# Patient Record
Sex: Male | Born: 1960 | Race: Black or African American | Hispanic: No | Marital: Single | Smoking: Current every day smoker
Health system: Southern US, Community
[De-identification: ages and names within clinical notes are randomized; demographics above are authoritative.]

## PROBLEM LIST (undated history)

## (undated) DIAGNOSIS — I10 Essential (primary) hypertension: Secondary | ICD-10-CM

## (undated) DIAGNOSIS — M549 Dorsalgia, unspecified: Secondary | ICD-10-CM

## (undated) DIAGNOSIS — G8929 Other chronic pain: Secondary | ICD-10-CM

---

## 2004-11-24 ENCOUNTER — Emergency Department (HOSPITAL_COMMUNITY): Admission: EM | Admit: 2004-11-24 | Discharge: 2004-11-25 | Payer: Self-pay

## 2012-01-08 ENCOUNTER — Encounter (HOSPITAL_COMMUNITY): Payer: Self-pay | Admitting: *Deleted

## 2012-01-08 ENCOUNTER — Emergency Department (INDEPENDENT_AMBULATORY_CARE_PROVIDER_SITE_OTHER): Payer: Medicaid Other

## 2012-01-08 ENCOUNTER — Emergency Department (INDEPENDENT_AMBULATORY_CARE_PROVIDER_SITE_OTHER)
Admission: EM | Admit: 2012-01-08 | Discharge: 2012-01-08 | Disposition: A | Discharge status: Home / Self Care | Payer: Medicaid Other | Source: Home / Self Care | Attending: Emergency Medicine | Admitting: Emergency Medicine

## 2012-01-08 DIAGNOSIS — S6990XA Unspecified injury of unspecified wrist, hand and finger(s), initial encounter: Secondary | ICD-10-CM

## 2012-01-08 MED ORDER — IBUPROFEN 800 MG PO TABS: 800.0000 mg | ORAL_TABLET | Freq: Three times a day (TID) | ORAL | Status: AC

## 2012-01-08 NOTE — ED Provider Notes (Signed)
History     CSN: [id number]  Arrival date & time 01/08/12  1421   First MD Initiated Contact with Patient 01/08/12 1439      Chief Complaint  Patient presents with  . Finger Injury    (Consider location/radiation/quality/duration/timing/severity/associated sxs/prior treatment) HPI Comments: Finger got in a car door, the swelling has improved,but still hurts and cant move my finger well, maybe its broken? No numbness No tingling  Patient is a 51 y.o. male presenting with hand pain.  Hand Pain This is a new problem. The current episode started more than 2 days ago. The problem occurs constantly. The problem has been gradually improving. Exacerbated by: MOVING FINGER. The symptoms are relieved by ice and NSAIDs. He has tried rest and a cold compress for the symptoms. The treatment provided mild relief.    History reviewed. No pertinent past medical history.  History reviewed. No pertinent past surgical history.  History reviewed. No pertinent family history.  History  Substance Use Topics  . Smoking status: Current Everyday Smoker -- 1.0 packs/day for 20 years    Types: Cigarettes  . Smokeless tobacco: Not on file  . Alcohol Use: No      Review of Systems  Constitutional: Negative for fever.  Musculoskeletal: Positive for joint swelling.  Skin: Negative for rash and wound.  Neurological: Negative for weakness and numbness.    Allergies  Review of patient's allergies indicates no known allergies.  Home Medications   Current Outpatient Rx  Name Route Sig Dispense Refill  . IBUPROFEN 800 MG PO TABS Oral Take 1 tablet (800 mg total) by mouth 3 (three) times daily. 21 tablet 0    BP 112/81  Pulse 65  Temp(Src) 98.3 F (36.8 C) (Oral)  Resp 16  SpO2 98%  Physical Exam  Constitutional: He appears well-developed and well-nourished. No distress.  Neck: Neck supple.  Musculoskeletal: He exhibits tenderness.       Right wrist: He exhibits bony tenderness.    Neurological: He exhibits normal muscle tone. Coordination normal.  Skin: No rash noted.       ED Course  Procedures (including critical care time)  Labs Reviewed - No data to display Dg Finger Ring Right  01/08/2012  *RADIOLOGY REPORT*  Clinical Data: Ring finger pain.  Trauma.  RIGHT RING FINGER 2+V  Comparison: None.  Findings: Anatomic alignment.  There is no fracture identified. Soft tissue swelling is present over the PIP joint.  No radiopaque foreign body.  IMPRESSION: No acute osseous abnormality.  Soft tissue swelling.  Original Report Authenticated By: Andreas Newport, M.D.     1. Finger injury       MDM  R 4TH PIP COMPRESSION TYPE INJURY NO OBVIOUS FRACTURE PER RADIOLOGIST WITH LIMITED FINGER ROM        Jimmie Molly, MD 01/08/12 1925

## 2012-01-08 NOTE — ED Notes (Signed)
4 days ago pt caught his hand in a car door.  The swellling has gotten worse and he mainly c/o pain lf the right ring finger

## 2013-06-28 ENCOUNTER — Emergency Department (HOSPITAL_COMMUNITY)
Admission: EM | Admit: 2013-06-28 | Discharge: 2013-06-28 | Disposition: A | Discharge status: Home / Self Care | Payer: Medicaid Other | Source: Home / Self Care | Attending: Family Medicine | Admitting: Family Medicine

## 2013-06-28 ENCOUNTER — Emergency Department (INDEPENDENT_AMBULATORY_CARE_PROVIDER_SITE_OTHER): Payer: Medicaid Other

## 2013-06-28 ENCOUNTER — Encounter (HOSPITAL_COMMUNITY): Payer: Self-pay | Admitting: *Deleted

## 2013-06-28 DIAGNOSIS — J40 Bronchitis, not specified as acute or chronic: Secondary | ICD-10-CM

## 2013-06-28 DIAGNOSIS — J410 Simple chronic bronchitis: Secondary | ICD-10-CM

## 2013-06-28 MED ORDER — CEFUROXIME AXETIL 250 MG PO TABS: 250.0000 mg | ORAL_TABLET | Freq: Two times a day (BID) | ORAL | Status: DC

## 2013-06-28 NOTE — ED Notes (Signed)
Cough prod. of white sputum for at least week.  Sore throat onset 3 days ago.  Had chills and fever 3-4 days.

## 2013-06-28 NOTE — ED Provider Notes (Signed)
   History    CSN: [id number] Arrival date & time 06/28/13  1191  First MD Initiated Contact with Patient 06/28/13 1752     Chief Complaint  Patient presents with  . Cough   (Consider location/radiation/quality/duration/timing/severity/associated sxs/prior Treatment) Patient is a 52 y.o. male presenting with cough. The history is provided by the patient.  Cough Cough characteristics:  Productive Sputum characteristics:  White and frothy Severity:  Mild Onset quality:  Gradual Duration:  1 week Progression:  Unchanged Chronicity:  Recurrent Smoker: yes   Associated symptoms: chills, fever and sore throat   Associated symptoms: no shortness of breath and no wheezing    History reviewed. No pertinent past medical history. History reviewed. No pertinent past surgical history. Family History  Problem Relation Age of Onset  . Hypertension Mother   . Osteoarthritis Mother    History  Substance Use Topics  . Smoking status: Current Every Day Smoker -- 1.00 packs/day for 20 years    Types: Cigarettes  . Smokeless tobacco: Not on file  . Alcohol Use: No    Review of Systems  Constitutional: Positive for fever and chills.  HENT: Positive for congestion and sore throat.   Respiratory: Positive for cough. Negative for shortness of breath and wheezing.   Cardiovascular: Negative.   Gastrointestinal: Negative.     Allergies  Review of patient's allergies indicates no known allergies.  Home Medications   Current Outpatient Rx  Name  Route  Sig  Dispense  Refill  . cefUROXime (CEFTIN) 250 MG tablet   Oral   Take 1 tablet (250 mg total) by mouth 2 (two) times daily.   20 tablet   0    BP 131/73  Pulse 77  Temp(Src) 97.8 F (36.6 C) (Oral)  Resp 16  SpO2 100% Physical Exam  Nursing note and vitals reviewed. Constitutional: He is oriented to person, place, and time. He appears well-developed and well-nourished.  HENT:  Head: Normocephalic.  Right Ear: External ear  normal.  Left Ear: External ear normal.  Mouth/Throat: Oropharynx is clear and moist.  Eyes: Conjunctivae are normal. Pupils are equal, round, and reactive to light.  Neck: Normal range of motion. Neck supple.  Cardiovascular: Normal heart sounds and intact distal pulses.   Pulmonary/Chest: Effort normal. He has decreased breath sounds. He has no wheezes. He has rhonchi.  Lymphadenopathy:    He has no cervical adenopathy.  Neurological: He is alert and oriented to person, place, and time.  Skin: Skin is warm and dry.    ED Course  Procedures (including critical care time) Labs Reviewed - No data to display Dg Chest 2 View  06/28/2013   *RADIOLOGY REPORT*  Clinical Data: Cough.  Fever.  CHEST - 2 VIEW  Comparison: None.  Findings: The heart, mediastinum and hila are within normal limits. The lungs are clear.  No pleural effusion or pneumothorax.  There is a mild curvature of the thoracic spine, convex to the right. The bony thorax is otherwise unremarkable.  IMPRESSION: No active disease of the chest.   Original Report Authenticated By: Amie Portland, M.D.   1. Bronchitis due to tobacco use     MDM  X-rays reviewed and report per radiologist.   Linna Hoff, MD 06/28/13 [phone]

## 2015-07-18 ENCOUNTER — Emergency Department (HOSPITAL_COMMUNITY): Payer: Medicaid Other

## 2015-07-18 ENCOUNTER — Emergency Department (HOSPITAL_COMMUNITY)
Admission: EM | Admit: 2015-07-18 | Discharge: 2015-07-18 | Disposition: A | Discharge status: Home / Self Care | Payer: Medicaid Other | Attending: Emergency Medicine | Admitting: Emergency Medicine

## 2015-07-18 ENCOUNTER — Encounter (HOSPITAL_COMMUNITY): Payer: Self-pay | Admitting: Emergency Medicine

## 2015-07-18 DIAGNOSIS — Y9289 Other specified places as the place of occurrence of the external cause: Secondary | ICD-10-CM | POA: Diagnosis not present

## 2015-07-18 DIAGNOSIS — Y9389 Activity, other specified: Secondary | ICD-10-CM | POA: Diagnosis not present

## 2015-07-18 DIAGNOSIS — Z792 Long term (current) use of antibiotics: Secondary | ICD-10-CM | POA: Insufficient documentation

## 2015-07-18 DIAGNOSIS — Y998 Other external cause status: Secondary | ICD-10-CM | POA: Diagnosis not present

## 2015-07-18 DIAGNOSIS — W231XXA Caught, crushed, jammed, or pinched between stationary objects, initial encounter: Secondary | ICD-10-CM | POA: Insufficient documentation

## 2015-07-18 DIAGNOSIS — S6992XA Unspecified injury of left wrist, hand and finger(s), initial encounter: Secondary | ICD-10-CM | POA: Insufficient documentation

## 2015-07-18 DIAGNOSIS — M79646 Pain in unspecified finger(s): Secondary | ICD-10-CM

## 2015-07-18 DIAGNOSIS — Z720 Tobacco use: Secondary | ICD-10-CM | POA: Diagnosis not present

## 2015-07-18 DIAGNOSIS — G8929 Other chronic pain: Secondary | ICD-10-CM | POA: Diagnosis not present

## 2015-07-18 HISTORY — DX: Dorsalgia, unspecified: M54.9

## 2015-07-18 HISTORY — DX: Other chronic pain: G89.29

## 2015-07-18 MED ORDER — NAPROXEN 500 MG PO TABS: 500.0000 mg | ORAL_TABLET | Freq: Two times a day (BID) | ORAL | Status: DC

## 2015-07-18 NOTE — ED Notes (Signed)
Patient returned from radiology

## 2015-07-18 NOTE — ED Notes (Signed)
Pt A+ox4, reports car jack landed on L 5th finger while changing a tire earlier today.  C/o pain to finger.  Mild swelling noted.  Skin otherwise PWD.  MAEI.  +csm/+pulses.  No deformities noted.  Speaking full/clear sentences, rr even/un-lab.  Ambulatory with steady gait.  NAD.

## 2015-07-18 NOTE — ED Provider Notes (Signed)
CSN: [id number]     Arrival date & time 07/18/15  1832 History  This chart was scribed for Santiago Glad, PA-C, working with Blake Divine, MD by Chestine Spore, ED Scribe. The patient was seen in room WTR8/WTR8 at 7:10 PM.    Chief Complaint  Patient presents with  . Finger Injury    car jack landed on L 5th finger      The history is provided by the patient. No language interpreter was used.    HPI Comments: Steven Shea is a 54 y.o. male who presents to the Emergency Department complaining of left fifth finger injury onset PTA. Pt notes that he was changing a tire today when the car jack landed on his finger. Pt notes that his hand was caught between the wheel and the car. Pt notes that his pain is worsened with movement and that the pain is radiating to his left wrist. He states that he is having associated symptoms of mild swelling, aching sensation to left hand. He states that he has not tried any medications for the relief for his symptoms. He denies color change, wound, rash, numbness, tingling, and any other symptoms.   Past Medical History  Diagnosis Date  . Chronic back pain    History reviewed. No pertinent past surgical history. Family History  Problem Relation Age of Onset  . Hypertension Mother   . Osteoarthritis Mother    History  Substance Use Topics  . Smoking status: Current Every Day Smoker -- 1.00 packs/day for 20 years    Types: Cigarettes  . Smokeless tobacco: Not on file  . Alcohol Use: No    Review of Systems  Musculoskeletal: Positive for joint swelling and arthralgias.  Skin: Negative for color change, rash and wound.  Neurological: Negative for numbness.       No tingling      Allergies  Review of patient's allergies indicates no known allergies.  Home Medications   Prior to Admission medications   Medication Sig Start Date End Date Taking? Authorizing Provider  cefUROXime (CEFTIN) 250 MG tablet Take 1 tablet (250 mg total) by mouth  2 (two) times daily. 06/28/13   Linna Hoff, MD   BP 117/94 mmHg  Pulse 90  Temp(Src) 98.9 F (37.2 C) (Oral)  Resp 16  Ht 6' (1.829 m)  Wt 200 lb (90.719 kg)  BMI 27.12 kg/m2  SpO2 99% Physical Exam  Constitutional: He is oriented to person, place, and time. He appears well-developed and well-nourished. No distress.  HENT:  Head: Normocephalic and atraumatic.  Eyes: EOM are normal.  Neck: Neck supple. No tracheal deviation present.  Cardiovascular: Normal rate, regular rhythm and normal heart sounds.  Exam reveals no gallop and no friction rub.   No murmur heard. Pulmonary/Chest: Effort normal and breath sounds normal. No respiratory distress. He has no wheezes. He has no rales.  Musculoskeletal: Normal range of motion.  Mild swelling of the DIP of the left fifth digit. Pain with ROM. Skin intact.  Neurological: He is alert and oriented to person, place, and time.  Distal sensation of the left 5th finger intact  Skin: Skin is warm and dry.  Psychiatric: He has a normal mood and affect. His behavior is normal.  Nursing note and vitals reviewed.   ED Course  Procedures (including critical care time) DIAGNOSTIC STUDIES: Oxygen Saturation is 99% on RA, nl by my interpretation.    COORDINATION OF CARE: 7:13 PM Discussed treatment plan with pt at bedside  and pt agreed to plan.  Labs Review Labs Reviewed - No data to display  Imaging Review Dg Finger Little Left  07/18/2015   CLINICAL DATA:  Blow to the left little finger today. Pain. Initial encounter.  EXAM: LEFT LITTLE FINGER 2+V  COMPARISON:  None.  FINDINGS: Imaged bones, joints and soft tissues appear normal.  IMPRESSION: Negative exam.   Electronically Signed   By: Drusilla Kanner M.D.   On: 07/18/2015 19:42     EKG Interpretation None      MDM   Final diagnoses:  None  Patient presents today with a finger injury.  Xray negative.  Patient stable for discharge.  Return precautions given.   I personally  performed the services described in this documentation, which was scribed in my presence. The recorded information has been reviewed and is accurate.    Santiago Glad, PA-C 07/18/15 2233  Blake Divine, MD 07/21/15 [phone]

## 2018-11-16 ENCOUNTER — Emergency Department (HOSPITAL_COMMUNITY)
Admission: EM | Admit: 2018-11-16 | Discharge: 2018-11-16 | Disposition: A | Discharge status: Home / Self Care | Payer: Medicaid Other | Attending: Emergency Medicine | Admitting: Emergency Medicine

## 2018-11-16 ENCOUNTER — Encounter (HOSPITAL_COMMUNITY): Payer: Self-pay | Admitting: Emergency Medicine

## 2018-11-16 DIAGNOSIS — R21 Rash and other nonspecific skin eruption: Secondary | ICD-10-CM | POA: Diagnosis not present

## 2018-11-16 DIAGNOSIS — F17210 Nicotine dependence, cigarettes, uncomplicated: Secondary | ICD-10-CM | POA: Diagnosis not present

## 2018-11-16 DIAGNOSIS — I10 Essential (primary) hypertension: Secondary | ICD-10-CM | POA: Insufficient documentation

## 2018-11-16 HISTORY — DX: Essential (primary) hypertension: I10

## 2018-11-16 LAB — CBG MONITORING, ED: Glucose-Capillary: 91 mg/dL (ref 70–99)

## 2018-11-16 MED ORDER — TERBINAFINE HCL 1 % EX CREA: 1.0000 "application " | TOPICAL_CREAM | Freq: Two times a day (BID) | CUTANEOUS | 0 refills | Status: AC

## 2018-11-16 NOTE — ED Notes (Signed)
Bed: WTR7 Expected date:  Expected time:  Means of arrival:  Comments: 

## 2018-11-16 NOTE — Discharge Instructions (Signed)
Please see the information and instructions below regarding your visit.  Your diagnoses today include:  1. Rash and nonspecific skin eruption     Tests performed today include: See side panel of your discharge paperwork for testing performed today. Vital signs are listed at the bottom of these instructions.   Medications prescribed:    Take any prescribed medications only as prescribed, and any over the counter medications only as directed on the packaging.  Please apply the antifungal cream to the rash of the chest and the legs twice a day for a week.  You may use an over-the-counter topical Dr. Margart SicklesScholl's type treatment for plantar warts to try on the lesions of the right palm.  Home care instructions:  Please follow any educational materials contained in this packet.   Follow-up instructions: Please follow-up with your primary care provider in as soon as possible for further evaluation of your symptoms if they are not completely improved.    Return instructions:  Please return to the Emergency Department if you experience worsening symptoms.  Please return the emergency department if you develop any fevers, chills, weight loss, headaches, neck stiffness or pain, chest pain, shortness of breath, dizziness or lightheadedness with your symptoms. Please return if you have any other emergent concerns.  Additional Information:   Your vital signs today were: BP (!) 134/91 (BP Location: Left Arm)    Pulse 65    Temp 97.6 F (36.4 C) (Oral)    Resp 18    SpO2 99%  If your blood pressure (BP) was elevated on multiple readings during this visit above 130 for the top number or above 80 for the bottom number, please have this repeated by your primary care provider within one month. --------------  Thank you for allowing us to participate in your care today.

## 2018-11-16 NOTE — ED Notes (Signed)
CBG 91 

## 2018-11-16 NOTE — ED Provider Notes (Signed)
[facility]-EMERGENCY DEPT Provider Note   CSN: [id number] Arrival date & time: 11/16/18  1102     History   Chief Complaint Chief Complaint  Patient presents with  . Rash  . Foot Pain    HPI Steven Shea is a 57 y.o. male.  HPI  Patient is a 57 year old male with history of chronic back pain and hypertension presenting for a rash that he noticed on his legs, chest, and penis.  Patient reports that these lesions have been present for approximately 1 month.  He reports that the lesion on his penis showed up first.  Patient reports that he presented to the health department was tested for all sexual transmitted infections including HIV and syphilis.  He reports that his tests were nonreactive, and he was treated empirically for gonorrhea and chlamydia, but had no positive testing.  He reports that the lesions are no better couple weeks later, and he is no longer able to go to his primary care provider due to his PCP nor taking Medicaid, and presents to the emergency department.  Patient reports that all of the lesions are pruritic.  Patient denies any fevers, chills, night sweats, weight loss.  He denies any history of IVDU, autoimmune disease.  Patient denies any other systemic symptoms such as chest pain or shortness of breath, cough, abdominal pain, nausea, or vomiting.  Patient reports that he has 2 slightly raised lesions on the right palm but no other lesions of palms or soles.  No other treatments applied.  Past Medical History:  Diagnosis Date  . Chronic back pain   . Hypertension     There are no active problems to display for this patient.   History reviewed. No pertinent surgical history.      Home Medications    Prior to Admission medications   Not on File    Family History Family History  Problem Relation Age of Onset  . Hypertension Mother   . Osteoarthritis Mother     Social History Social History   Tobacco Use  . Smoking  status: Current Every Day Smoker    Packs/day: 1.00    Years: 20.00    Pack years: 20.00    Types: Cigarettes  . Smokeless tobacco: Never Used  Substance Use Topics  . Alcohol use: No  . Drug use: No     Allergies   Patient has no known allergies.   Review of Systems Review of Systems  Constitutional: Negative for chills and fever.  Respiratory: Negative for shortness of breath.   Cardiovascular: Negative for chest pain.  Gastrointestinal: Negative for abdominal pain, nausea and vomiting.  Skin: Positive for color change and rash. Negative for wound.  Allergic/Immunologic: Negative for immunocompromised state.     Physical Exam Updated Vital Signs BP (!) 134/91 (BP Location: Left Arm)   Pulse 65   Temp 97.6 F (36.4 C) (Oral)   Resp 18   SpO2 99%   Physical Exam  Constitutional: He appears well-developed and well-nourished. No distress.  Sitting comfortably in bed.  HENT:  Head: Normocephalic and atraumatic.  Eyes: Conjunctivae are normal. Right eye exhibits no discharge. Left eye exhibits no discharge.  EOMs normal to gross examination.  Neck: Normal range of motion.  Cardiovascular: Normal rate and regular rhythm.  No murmur heard. Pulmonary/Chest:  Normal respiratory effort. Patient converses comfortably. No audible wheeze or stridor.  Abdominal: He exhibits no distension.  Genitourinary:  Genitourinary Comments: EMT present as chaperone.  Patient  has a small papule 0.5 cm in diameter on the head of the penis.  Patient has some xerosis around the glans.  No erythema.  Musculoskeletal: Normal range of motion.  Neurological: He is alert.  Cranial nerves intact to gross observation. Patient moves extremities without difficulty.  Skin: Skin is warm and dry. He is not diaphoretic.  Patient has 2, raised, well-circumscribed plaques with overlying scale in bilateral pretibial region.  Patient also has a similar lesion 1 cm in diameter on the chest.  See clinical  photos for details.  No erythema surrounding any of these lesions. Right palm exhibits 2 raised, verrucous lesions. No other lesions of palms or soles.  No splinter hemorrhages.  Psychiatric: He has a normal mood and affect. His behavior is normal. Judgment and thought content normal.  Nursing note and vitals reviewed.      ED Treatments / Results  Labs (all labs ordered are listed, but only abnormal results are displayed) Labs Reviewed - No data to display  EKG None  Radiology No results found.  Procedures Procedures (including critical care time)  Medications Ordered in ED Medications - No data to display   Initial Impression / Assessment and Plan / ED Course  I have reviewed the triage vital signs and the nursing notes.  Pertinent labs & imaging results that were available during my care of the patient were reviewed by me and considered in my medical decision making (see chart for details).     Patient is nontoxic-appearing, afebrile, and in no acute distress.  Clinically, patient does not appear to have cutaneous manifestations of systemic disease such as secondary syphilis, endocarditis, disseminated gonococcus, RMSF, or SJS/TEN.  Patient was recently tested for syphilis and HIV at the health department and had negative results per his report.  Do not feel that with this history he needs retesting today.  Lesions at different parts of the body appeared to be of different character and possibly different sources.  I discussed with the patient that the lesion of the penis appears consistent with possible HPV wart.  Lesions of the right palm are of character and quality of plantar warts.  Unclear etiology of the lesions on his pretibial region and chest, however they do appear to have possible fungal nature.  I discussed with the patient that the ultimate management will be with primary care and/or dermatology to do biopsy if they are not improving.  Patient amenable to this  plan.  Return precautions given for any systemic symptoms such as rapidly worsening rash, fever or chills, headache, chest pain, coughing, shortness breath, dizziness lightheadedness.   Final Clinical Impressions(s) / ED Diagnoses   Final diagnoses:  Rash and nonspecific skin eruption    ED Discharge Orders         Ordered    terbinafine (LAMISIL) 1 % cream  2 times daily     11/16/18 1317           Elisha Ponder, PA-C 11/16/18 1426    Doug Sou, MD 11/16/18 [phone]

## 2018-11-16 NOTE — Progress Notes (Signed)
[facility]. Contact information placed on dc instructions. ED RN to give pt information at time of dc.  Isidoro DonningAlesia Tamila Gaulin RN CCM Case Mgmt phone [phone]

## 2018-11-16 NOTE — ED Triage Notes (Signed)
Pt reports getting sores all over body for over 3 weeks. Reports went to health department who ran tests and gave medications for it but reports that sores arent getting better and are painful. Pt also having pains in bilat feet. PCP quit taking medicaid so cant afford to see him.

## 2022-03-12 ENCOUNTER — Other Ambulatory Visit: Payer: Self-pay

## 2022-03-12 ENCOUNTER — Emergency Department (HOSPITAL_COMMUNITY): Payer: Medicaid Other

## 2022-03-12 ENCOUNTER — Encounter (HOSPITAL_COMMUNITY): Payer: Self-pay

## 2022-03-12 ENCOUNTER — Emergency Department (HOSPITAL_COMMUNITY)
Admission: EM | Admit: 2022-03-12 | Discharge: 2022-03-12 | Disposition: A | Discharge status: Home / Self Care | Payer: Medicaid Other | Attending: Emergency Medicine | Admitting: Emergency Medicine

## 2022-03-12 DIAGNOSIS — S60221A Contusion of right hand, initial encounter: Secondary | ICD-10-CM | POA: Diagnosis not present

## 2022-03-12 DIAGNOSIS — W208XXA Other cause of strike by thrown, projected or falling object, initial encounter: Secondary | ICD-10-CM | POA: Diagnosis not present

## 2022-03-12 DIAGNOSIS — S6991XA Unspecified injury of right wrist, hand and finger(s), initial encounter: Secondary | ICD-10-CM | POA: Diagnosis present

## 2022-03-12 MED ORDER — ACETAMINOPHEN 500 MG PO TABS
1000.0000 mg | ORAL_TABLET | Freq: Once | ORAL | Status: AC
  Administered 2022-03-12: 1000 mg via ORAL
  Filled 2022-03-12: qty 2

## 2022-03-12 MED ORDER — IBUPROFEN 200 MG PO TABS
400.0000 mg | ORAL_TABLET | Freq: Once | ORAL | Status: AC
  Administered 2022-03-12: 400 mg via ORAL
  Filled 2022-03-12: qty 2

## 2022-03-12 NOTE — Discharge Instructions (Signed)
No signs of broken bones today.  Continue to ice, take 2 Tylenol and 2 ibuprofen together every 6 hours for pain. ?

## 2022-03-12 NOTE — ED Triage Notes (Signed)
Patient reports that he was changing a tire with a scissors jack and the jack fell over and caught his right hand 2 days ago. Patient c/o pain and swelling to the right hand. ?

## 2022-03-12 NOTE — ED Provider Notes (Signed)
?[facility]-EMERGENCY DEPT ?Provider Note ? ? ?CSN: [id number] ?Arrival date & time: 03/12/22  1029 ? ?  ? ?History ? ?Chief Complaint  ?Patient presents with  ? Hand Injury  ? ? ?Steven Shea is a 61 y.o. male. ? ?Patient is a 61 year old male with no significant medical problems presenting today with right hand pain and swelling.  Patient reports that he was changing a tire with a jack 2 days ago when the jack fell over catching his right hand.  Since that time his hand does hurt but it has become more painful today.  It has been swollen the entire time.  He denies any numbness or tingling of his fingers.  No pain in his wrist.  He did take an ibuprofen 2 days ago but has not had anything else for the pain. ? ?The history is provided by the patient.  ?Hand Injury ? ?  ? ?Home Medications ?Prior to Admission medications   ?Medication Sig Start Date End Date Taking? Authorizing Provider  ?terbinafine (LAMISIL) 1 % cream Apply 1 application topically 2 (two) times daily. Apply to rash of chest and legs twice a day for 7 days. 11/16/18   Elisha Ponder, PA-C  ?   ? ?Allergies    ?Patient has no known allergies.   ? ?Review of Systems   ?Review of Systems ? ?Physical Exam ?Updated Vital Signs ?BP (!) 134/109 (BP Location: Left Arm)   Pulse 63   Temp 98.2 ?F (36.8 ?C) (Oral)   Resp 16   Ht 6' (1.829 m)   Wt 102.1 kg   SpO2 98%   BMI 30.52 kg/m?  ?Physical Exam ?Vitals and nursing note reviewed.  ?Cardiovascular:  ?   Rate and Rhythm: Normal rate.  ?Pulmonary:  ?   Effort: Pulmonary effort is normal.  ?Musculoskeletal:     ?   General: Tenderness and signs of injury present.  ?     Hands: ? ?Skin: ?   General: Skin is warm and dry.  ?Neurological:  ?   Mental Status: He is alert. Mental status is at baseline.  ?Psychiatric:     ?   Mood and Affect: Mood normal.  ? ? ?ED Results / Procedures / Treatments   ?Labs ?(all labs ordered are listed, but only abnormal results are displayed) ?Labs  Reviewed - No data to display ? ?EKG ?None ? ?Radiology ?DG Hand Complete Right ? ?Result Date: 03/12/2022 ?CLINICAL DATA:  Injury 2 days ago with pain and swelling of the thumb and metacarpals EXAM: RIGHT HAND - COMPLETE 3+ VIEW COMPARISON:  01/08/2012 FINDINGS: No evidence of acute fracture. There is chronic degenerative change and mild subluxation at the MCP joint of the thumb. Other bones in the region appear normal. IMPRESSION: No acute or traumatic finding. 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 Electronically Signed   By: Paulina Fusi M.D.   On: 03/12/2022 11:10   ? ?Procedures ?Procedures  ? ? ?Medications Ordered in ED ?Medications  ?acetaminophen (TYLENOL) tablet 1,000 mg (1,000 mg Oral Given 03/12/22 1114)  ?ibuprofen (ADVIL) tablet 400 mg (400 mg Oral Given 03/12/22 1114)  ? ? ?ED Course/ Medical Decision Making/ A&P ?  ?                        ?Medical Decision Making ?Amount and/or Complexity of Data Reviewed ?Radiology: ordered. ? ?Risk ?OTC drugs. ? ? ?Patient is a 61 year old  male presenting today with complaints of hand pain after an injury 2 days ago.  He has significant swelling over the fourth and fifth metacarpals.  Concern for possible fracture.  He has no wrist pain or discomfort with low suspicion for acute wrist injury. ? ?3:49 PM ?I independently interpreted and visualized patient's x-ray today which shows no evidence of acute fracture.  Radiology reports chronic changes in the MCP joint of the thumb consistent with arthritis but no other acute findings.  We will treat for hand contusion.  Findings were discussed with the patient.  He is stable for discharge at this time. ? ? ? ? ? ? ? ?Final Clinical Impression(s) / ED Diagnoses ?Final diagnoses:  ?Contusion of right hand, initial encounter  ? ? ?Rx / DC Orders ?ED Discharge Orders   ? ? None  ? ?  ? ? ?  ?Gwyneth Sprout, MD ?03/12/22 1549 ? ?

## 2022-11-20 ENCOUNTER — Other Ambulatory Visit: Payer: Self-pay | Admitting: Internal Medicine

## 2022-11-20 DIAGNOSIS — Z87891 Personal history of nicotine dependence: Secondary | ICD-10-CM

## 2023-05-25 IMAGING — CR DG HAND COMPLETE 3+V*R*
3 series · 3 of 3 positions shown · non-contrast
Comparison: 01/08/2012

CLINICAL DATA: Injury 2 days ago with pain and swelling of the
thumb and metacarpals

EXAM:
RIGHT HAND - COMPLETE 3+ VIEW

[x hand pa right]
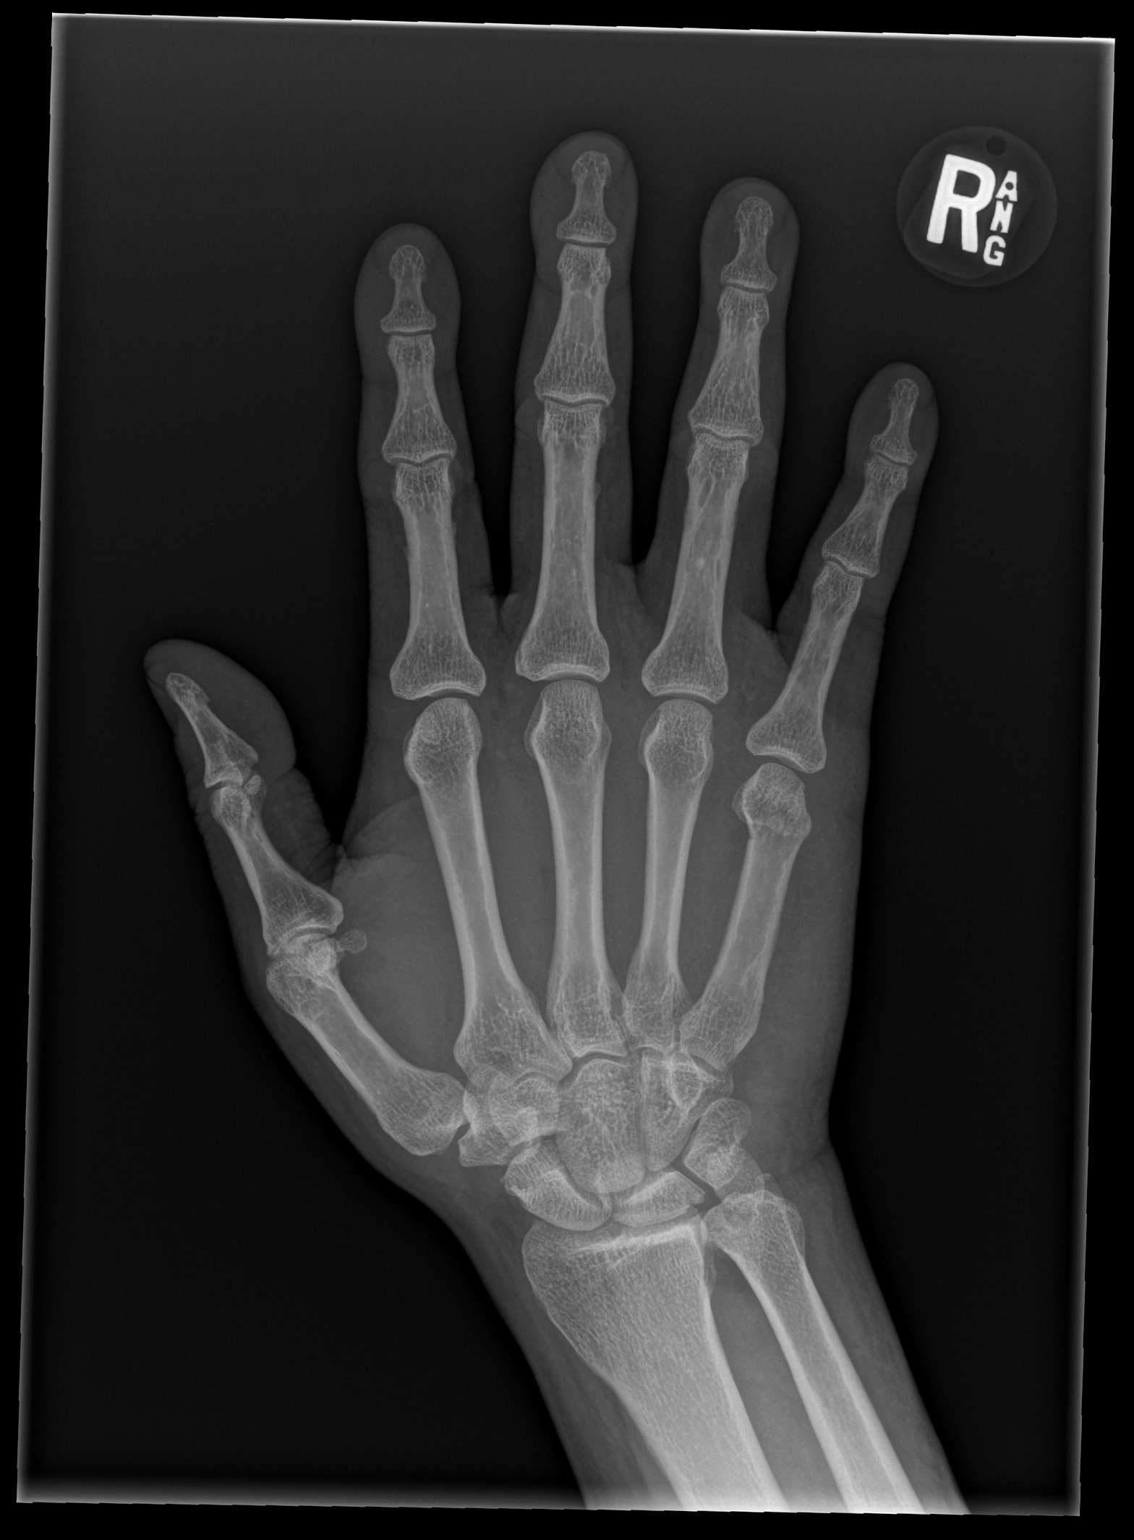

[x hand obl right]
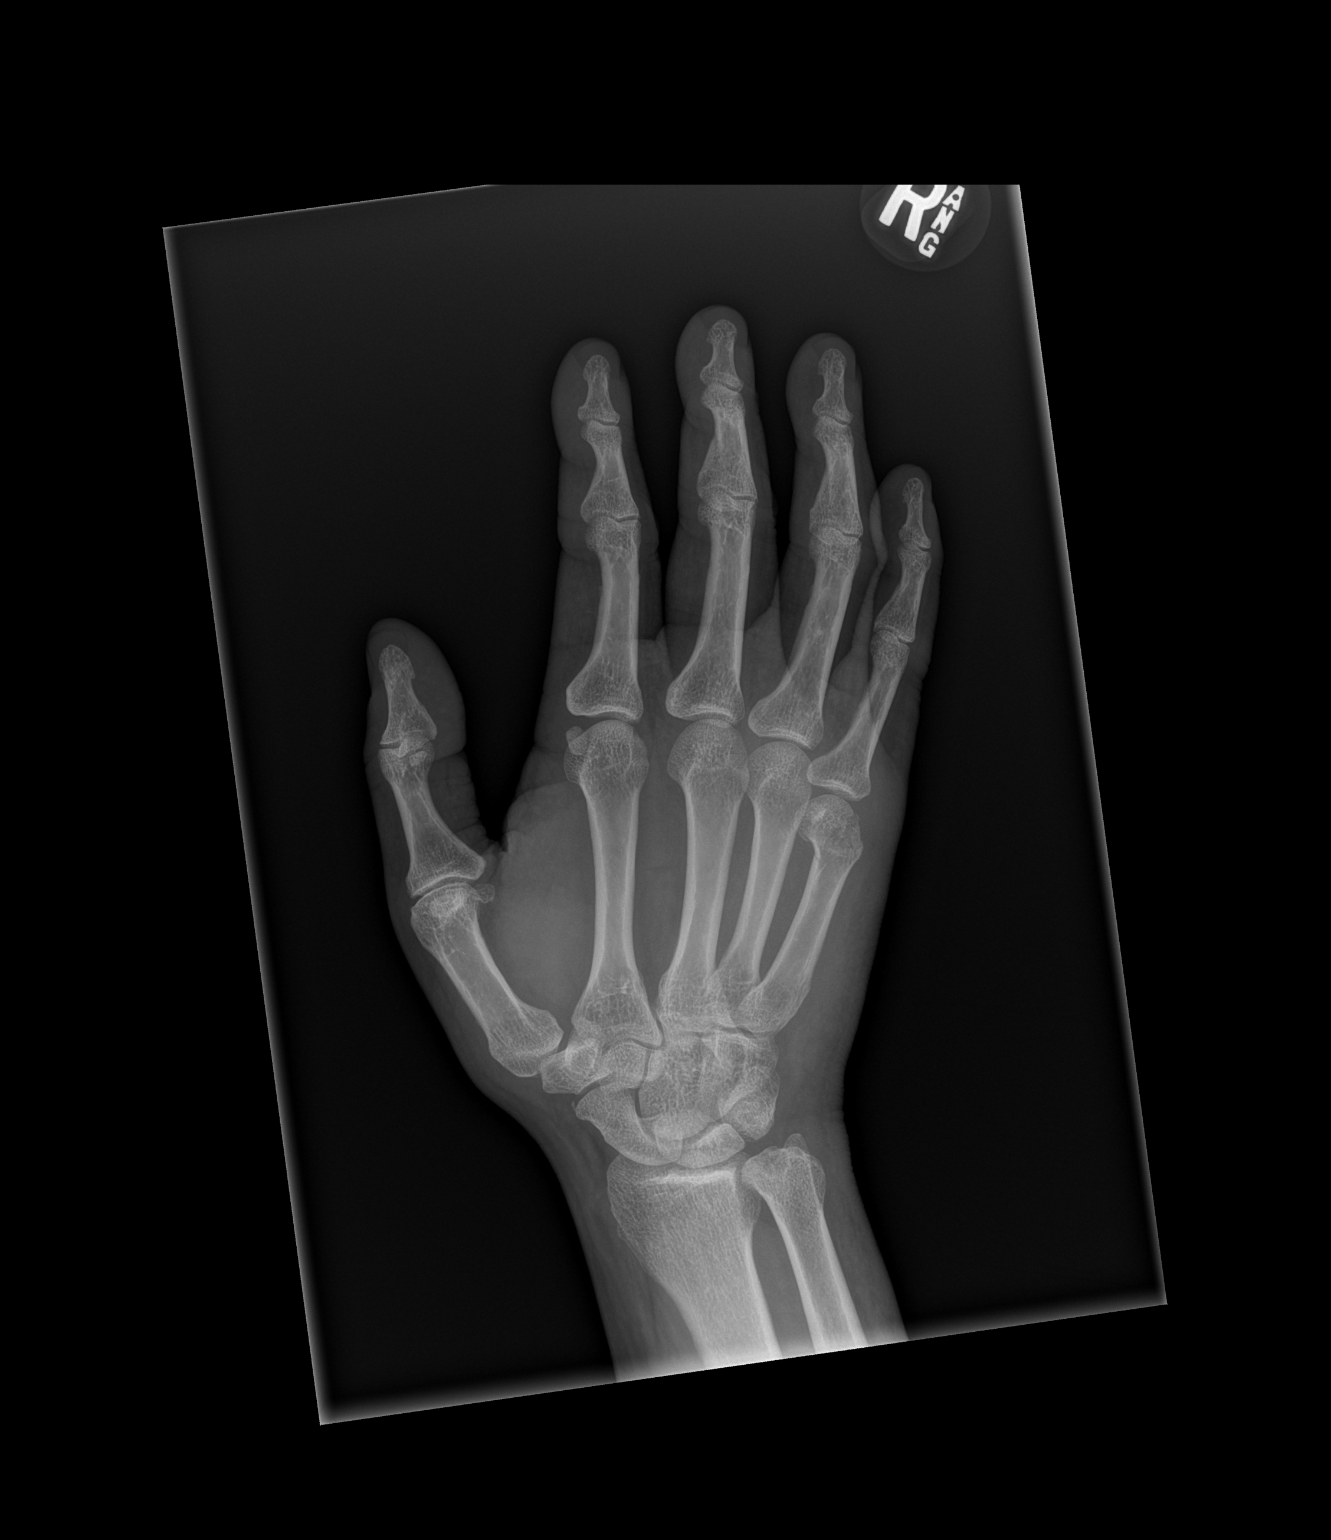

[x hand lat right]
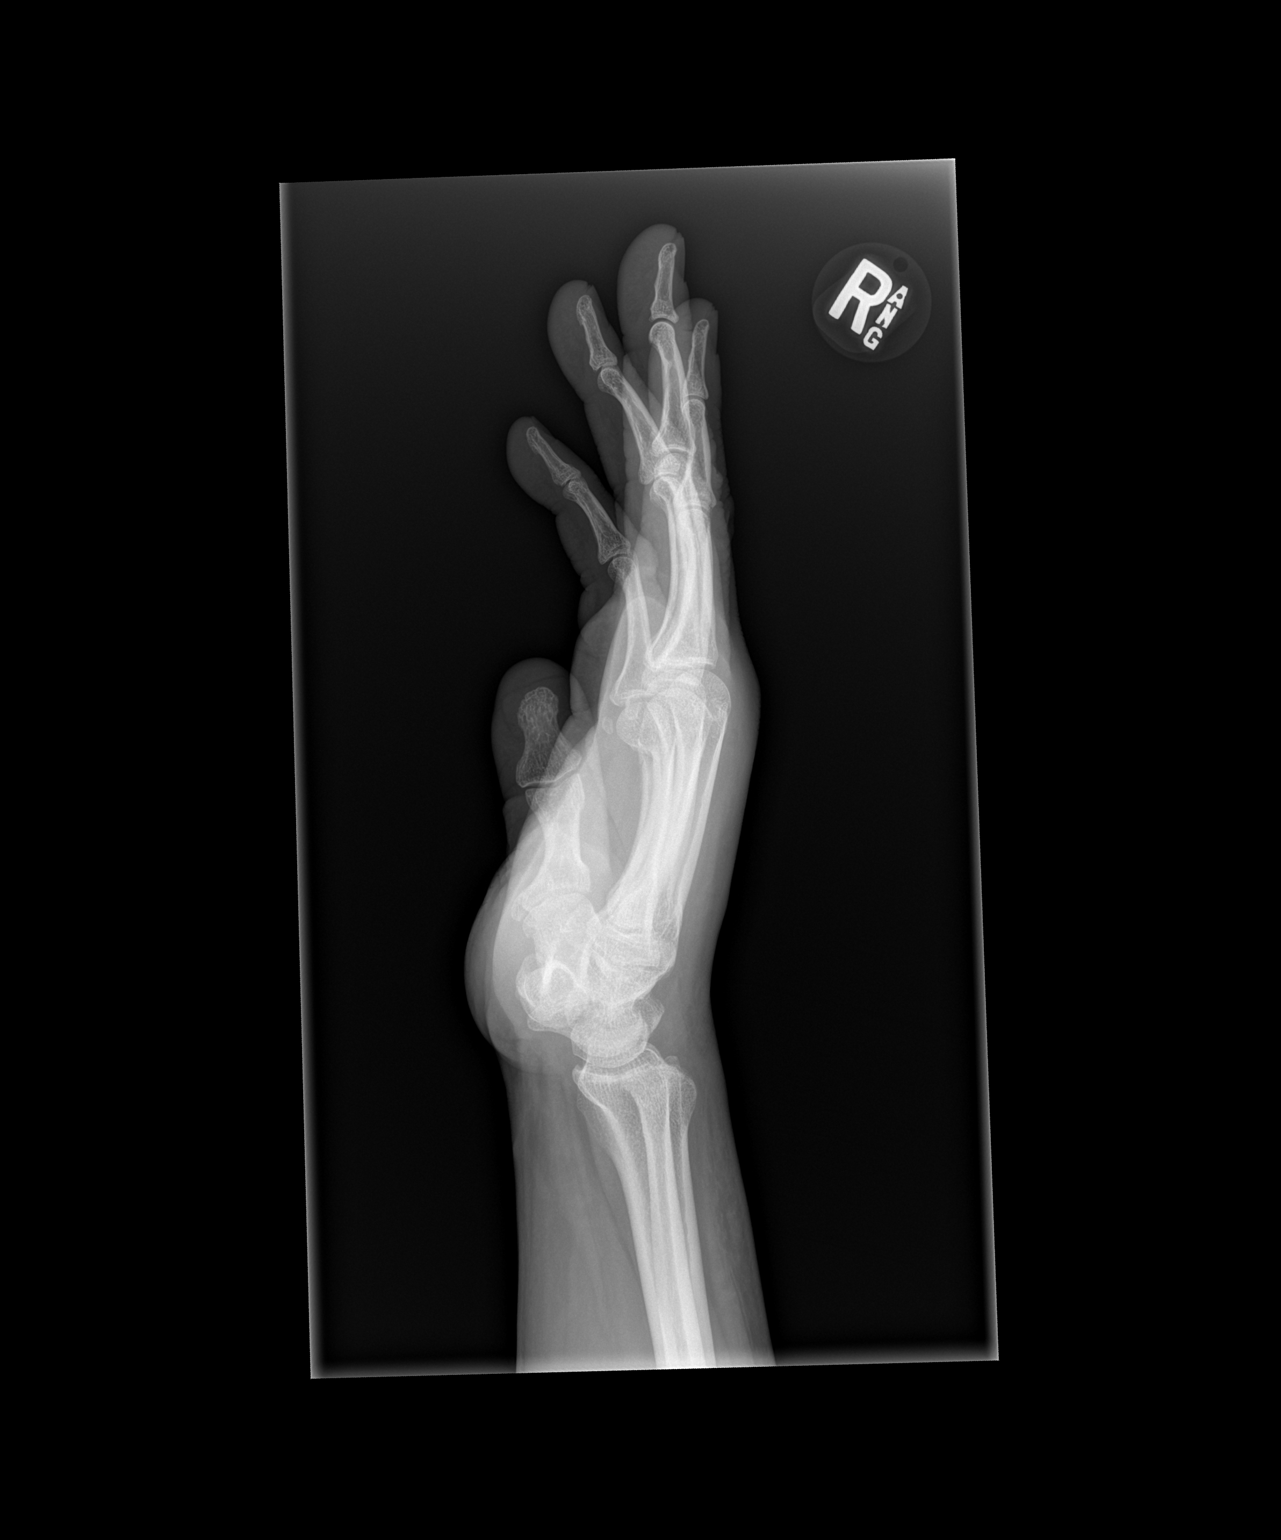

[3 of 3 positions shown; findings below may reference images not displayed]

FINDINGS: No evidence of acute fracture. There is chronic degenerative change
and mild subluxation at the MCP joint of the thumb. Other bones in
the region appear normal.
IMPRESSION: No acute or traumatic finding. Chronic appearing degenerative
changes at the MCP joint of the thumb.
